# Patient Record
Sex: Male | Born: 2005 | Race: White | Hispanic: No | Marital: Single | Smoking: Never smoker
Health system: Southern US, Community
[De-identification: ages and names within clinical notes are randomized; demographics above are authoritative.]

## PROBLEM LIST (undated history)

## (undated) DIAGNOSIS — F840 Autistic disorder: Secondary | ICD-10-CM

## (undated) DIAGNOSIS — F909 Attention-deficit hyperactivity disorder, unspecified type: Secondary | ICD-10-CM

## (undated) DIAGNOSIS — J45990 Exercise induced bronchospasm: Secondary | ICD-10-CM

## (undated) DIAGNOSIS — D45 Polycythemia vera: Secondary | ICD-10-CM

## (undated) DIAGNOSIS — Z87442 Personal history of urinary calculi: Secondary | ICD-10-CM

## (undated) DIAGNOSIS — F88 Other disorders of psychological development: Secondary | ICD-10-CM

## (undated) HISTORY — PX: NO PAST SURGERIES: SHX2092

---
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
## 2023-01-06 ENCOUNTER — Encounter (HOSPITAL_BASED_OUTPATIENT_CLINIC_OR_DEPARTMENT_OTHER): Payer: Self-pay

## 2023-01-06 ENCOUNTER — Other Ambulatory Visit: Payer: Self-pay

## 2023-01-06 ENCOUNTER — Emergency Department (HOSPITAL_BASED_OUTPATIENT_CLINIC_OR_DEPARTMENT_OTHER)
Admission: EM | Admit: 2023-01-06 | Discharge: 2023-01-06 | Disposition: A | Discharge status: Home / Self Care | Payer: 59 | Attending: Emergency Medicine | Admitting: Emergency Medicine

## 2023-01-06 DIAGNOSIS — R21 Rash and other nonspecific skin eruption: Secondary | ICD-10-CM | POA: Diagnosis present

## 2023-01-06 DIAGNOSIS — L03115 Cellulitis of right lower limb: Secondary | ICD-10-CM | POA: Insufficient documentation

## 2023-01-06 MED ORDER — DOXYCYCLINE HYCLATE 100 MG PO CAPS: 100.0000 mg | ORAL_CAPSULE | Freq: Two times a day (BID) | ORAL | 0 refills | Status: AC

## 2023-01-06 MED ORDER — DOXYCYCLINE HYCLATE 100 MG PO TABS
100.0000 mg | ORAL_TABLET | Freq: Once | ORAL | Status: AC
  Administered 2023-01-06: 100 mg via ORAL
  Filled 2023-01-06: qty 1

## 2023-01-06 NOTE — ED Provider Notes (Signed)
Emergency Department Provider Note   I have reviewed the triage vital signs and the nursing notes.   HISTORY  Chief Complaint Insect Bite   HPI Anthony Pearson is a 17 y.o. male with PMH of ADHD presents emergency department for evaluation of right thigh rash.  He has noticed 2 areas of circular rash with swelling and some tenderness.  He is unsure what insect bit him but suspects a spider bite.  No fevers.  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  No known tick bites.   Past Medical History:  Diagnosis Date   ADHD (attention deficit hyperactivity disorder)    Polycythemia vera ([facility])    Sensory integration disorder of childhood     Review of Systems  Constitutional: No fever/chills Cardiovascular: Denies chest pain. Respiratory: Denies shortness of breath. Gastrointestinal: No abdominal pain.   Skin: Positive right leg rash.  ____________________________________________   PHYSICAL EXAM:  VITAL SIGNS: ED Triage Vitals  Enc Vitals Group     BP 01/06/23 2141 (!) 137/119     Pulse Rate 01/06/23 2141 95     Resp 01/06/23 2141 18     Temp 01/06/23 2141 98.6 F (37 C)     Temp Source 01/06/23 2141 Oral     SpO2 01/06/23 2141 100 %     Weight 01/06/23 2143 240 lb (108.9 kg)     Height 01/06/23 2143 6\' 1"  (1.854 m)   Constitutional: Alert and oriented. Well appearing and in no acute distress. Eyes: Conjunctivae are normal.  Head: Atraumatic. Nose: No congestion/rhinnorhea. Mouth/Throat: Mucous membranes are moist.   Neck: No stridor.   Respiratory: Normal respiratory effort. Gastrointestinal: No distention.  Musculoskeletal: Painful rash to the right thigh.  Neurologic:  Normal speech and language.  Skin:  Skin is warm, dry and intact. Two areas of circular rash to the posterior thigh. No fluctuance or obvious abscess.   ____________________________________________   PROCEDURES  Procedure(s)  performed:   Procedures  None  ____________________________________________   INITIAL IMPRESSION / ASSESSMENT AND PLAN / ED COURSE  Pertinent labs & imaging results that were available during my care of the patient were reviewed by me and considered in my medical decision making (see chart for details).   This patient is Presenting for Evaluation of rash, which does require a range of treatment options, and is a complaint that involves a moderate risk of morbidity and mortality.  The Differential Diagnoses include insect bite, tick bite, tinea, cellulitis, contact dermatitis, etc.  Medical Decision Making: Summary:  Patient presents the emergency department 2 areas of circular rash to the right upper thigh.  I do not appreciate any abscess or severe cellulitis.  No SIRS vitals. Plan to cover with Doxy and PCP follow up. Discussed strict ED return precautions.   Patient's presentation is most consistent with acute, uncomplicated illness.   Disposition: discharge  ____________________________________________  FINAL CLINICAL IMPRESSION(S) / ED DIAGNOSES  Final diagnoses:  Cellulitis of right lower extremity    NEW OUTPATIENT MEDICATIONS STARTED DURING THIS VISIT:  Discharge Medication List as of 01/06/2023 10:04 PM     START taking these medications   Details  doxycycline (VIBRAMYCIN) 100 MG capsule Take 1 capsule (100 mg total) by mouth 2 (two) times daily for 7 days., Starting Wed 01/06/2023, Until Wed 01/13/2023, Normal        Note:  This document was prepared using Dragon voice recognition software and may include unintentional  dictation errors.  Alona Bene, MD, [facility]    Tipton Ballow, Arlyss Repress, MD 01/07/23 [phone]

## 2023-01-06 NOTE — ED Triage Notes (Signed)
Suspected insect bite to posterior right upper leg. Unknown time of encounter. Noticed redness, swelling and tenderness that prompted evaluation.

## 2023-01-06 NOTE — ED Notes (Signed)
Pt verbalized understanding of d/c instructions, meds, and followup care. Denies questions. VSS, no distress noted. Steady gait to exit with all belongings.  ?

## 2023-01-06 NOTE — Discharge Instructions (Signed)
Please take your antibiotics as prescribed. Follow with your PCP in the coming week if symptoms worsen.

## 2023-04-30 ENCOUNTER — Other Ambulatory Visit: Payer: Self-pay | Admitting: Urology

## 2023-05-10 ENCOUNTER — Encounter (HOSPITAL_BASED_OUTPATIENT_CLINIC_OR_DEPARTMENT_OTHER): Payer: Self-pay | Admitting: Urology

## 2023-05-10 NOTE — Progress Notes (Signed)
Spoke w/ via phone for pre-op interview--- pt Lab needs dos----       no  Lab results------ no COVID test -----patient states asymptomatic no test needed Arrive at ------- 1145 on 05-14-2023 NPO after MN NO Solid Food.  Clear liquids from MN until--- 1045 Med rec completed Medications to take morning of surgery ----- none Diabetic medication ----- n/a Patient instructed no nail polish to be worn day of surgery Patient instructed to bring photo id and insurance card day of surgery Patient aware to have Driver (ride ) / caregiver    for 24 hours after surgery - step-father, Anthony Pearson Patient Special Instructions ----- n/a Pre-Op special Instructions -----  able to speak w/ pt with any issue, he is high functioning autism (goes to [employer] first year) Patient verbalized understanding of instructions that were given at this phone interview. Patient denies chest pain, sob, fever, cough at the interview.

## 2023-05-14 ENCOUNTER — Ambulatory Visit (HOSPITAL_BASED_OUTPATIENT_CLINIC_OR_DEPARTMENT_OTHER): Payer: 59 | Admitting: Certified Registered"

## 2023-05-14 ENCOUNTER — Encounter (HOSPITAL_BASED_OUTPATIENT_CLINIC_OR_DEPARTMENT_OTHER): Payer: Self-pay | Admitting: Urology

## 2023-05-14 ENCOUNTER — Encounter (HOSPITAL_BASED_OUTPATIENT_CLINIC_OR_DEPARTMENT_OTHER)
Admission: RE | Disposition: A | Discharge status: Home / Self Care | Payer: Self-pay | Source: Home / Self Care | Attending: Urology

## 2023-05-14 ENCOUNTER — Ambulatory Visit (HOSPITAL_BASED_OUTPATIENT_CLINIC_OR_DEPARTMENT_OTHER)
Admission: RE | Admit: 2023-05-14 | Discharge: 2023-05-14 | Disposition: A | Discharge status: Home / Self Care | Payer: 59 | Attending: Urology | Admitting: Urology

## 2023-05-14 ENCOUNTER — Other Ambulatory Visit: Payer: Self-pay

## 2023-05-14 DIAGNOSIS — F17210 Nicotine dependence, cigarettes, uncomplicated: Secondary | ICD-10-CM | POA: Insufficient documentation

## 2023-05-14 DIAGNOSIS — F901 Attention-deficit hyperactivity disorder, predominantly hyperactive type: Secondary | ICD-10-CM | POA: Insufficient documentation

## 2023-05-14 DIAGNOSIS — N471 Phimosis: Secondary | ICD-10-CM

## 2023-05-14 DIAGNOSIS — J45990 Exercise induced bronchospasm: Secondary | ICD-10-CM | POA: Diagnosis not present

## 2023-05-14 DIAGNOSIS — F840 Autistic disorder: Secondary | ICD-10-CM | POA: Diagnosis not present

## 2023-05-14 DIAGNOSIS — D45 Polycythemia vera: Secondary | ICD-10-CM | POA: Diagnosis not present

## 2023-05-14 DIAGNOSIS — Z01818 Encounter for other preprocedural examination: Secondary | ICD-10-CM

## 2023-05-14 HISTORY — DX: Personal history of urinary calculi: Z87.442

## 2023-05-14 HISTORY — DX: Exercise induced bronchospasm: J45.990

## 2023-05-14 HISTORY — PX: CIRCUMCISION: SHX1350

## 2023-05-14 HISTORY — DX: Autistic disorder: F84.0

## 2023-05-14 SURGERY — CIRCUMCISION, ADULT
Anesthesia: General | Site: Penis

## 2023-05-14 MED ORDER — OXYCODONE HCL 5 MG/5ML PO SOLN: 5.0000 mg | Freq: Once | ORAL | Status: DC | PRN

## 2023-05-14 MED ORDER — PROPOFOL 10 MG/ML IV BOLUS
INTRAVENOUS | Status: AC
  Filled 2023-05-14: qty 20

## 2023-05-14 MED ORDER — BUPIVACAINE HCL 0.5 % IJ SOLN
INTRAMUSCULAR | Status: DC | PRN
  Administered 2023-05-14: 20 mL

## 2023-05-14 MED ORDER — OXYCODONE-ACETAMINOPHEN 5-325 MG PO TABS: 1.0000 | ORAL_TABLET | ORAL | 0 refills | Status: AC | PRN

## 2023-05-14 MED ORDER — LACTATED RINGERS IV SOLN: INTRAVENOUS | Status: DC

## 2023-05-14 MED ORDER — MIDAZOLAM HCL 5 MG/5ML IJ SOLN
INTRAMUSCULAR | Status: DC | PRN
  Administered 2023-05-14: 2 mg via INTRAVENOUS

## 2023-05-14 MED ORDER — CEFAZOLIN SODIUM-DEXTROSE 2-4 GM/100ML-% IV SOLN
2.0000 g | Freq: Once | INTRAVENOUS | Status: AC
  Administered 2023-05-14: 2 g via INTRAVENOUS

## 2023-05-14 MED ORDER — SODIUM CHLORIDE 0.9 % IV SOLN: INTRAVENOUS | Status: DC

## 2023-05-14 MED ORDER — LIDOCAINE HCL (PF) 2 % IJ SOLN
INTRAMUSCULAR | Status: AC
  Filled 2023-05-14: qty 5

## 2023-05-14 MED ORDER — DEXAMETHASONE SODIUM PHOSPHATE 10 MG/ML IJ SOLN
INTRAMUSCULAR | Status: AC
  Filled 2023-05-14: qty 1

## 2023-05-14 MED ORDER — FENTANYL CITRATE (PF) 100 MCG/2ML IJ SOLN
INTRAMUSCULAR | Status: DC | PRN
  Administered 2023-05-14: 100 ug via INTRAVENOUS

## 2023-05-14 MED ORDER — ONDANSETRON HCL 4 MG/2ML IJ SOLN
INTRAMUSCULAR | Status: AC
  Filled 2023-05-14: qty 2

## 2023-05-14 MED ORDER — MEPERIDINE HCL 25 MG/ML IJ SOLN
6.2500 mg | INTRAMUSCULAR | Status: DC | PRN
Start: 2023-05-14 — End: 2023-05-14

## 2023-05-14 MED ORDER — STERILE WATER FOR IRRIGATION IR SOLN
Status: DC | PRN
  Administered 2023-05-14: 1000 mL

## 2023-05-14 MED ORDER — WHITE PETROLATUM EX OINT
TOPICAL_OINTMENT | CUTANEOUS | Status: AC
  Filled 2023-05-14: qty 5

## 2023-05-14 MED ORDER — DEXMEDETOMIDINE HCL IN NACL 80 MCG/20ML IV SOLN
INTRAVENOUS | Status: DC | PRN
  Administered 2023-05-14: 4 ug via INTRAVENOUS
  Administered 2023-05-14 (×2): 8 ug via INTRAVENOUS

## 2023-05-14 MED ORDER — OXYCODONE HCL 5 MG PO TABS: 5.0000 mg | ORAL_TABLET | Freq: Once | ORAL | Status: DC | PRN

## 2023-05-14 MED ORDER — FENTANYL CITRATE (PF) 100 MCG/2ML IJ SOLN: 25.0000 ug | INTRAMUSCULAR | Status: DC | PRN

## 2023-05-14 MED ORDER — DOCUSATE SODIUM 100 MG PO CAPS: 100.0000 mg | ORAL_CAPSULE | Freq: Every day | ORAL | 0 refills | Status: AC | PRN

## 2023-05-14 MED ORDER — MIDAZOLAM HCL 2 MG/2ML IJ SOLN
INTRAMUSCULAR | Status: AC
  Filled 2023-05-14: qty 2

## 2023-05-14 MED ORDER — ONDANSETRON HCL 4 MG/2ML IJ SOLN
INTRAMUSCULAR | Status: DC | PRN
  Administered 2023-05-14: 4 mg via INTRAVENOUS

## 2023-05-14 MED ORDER — ACETAMINOPHEN 325 MG PO TABS: 325.0000 mg | ORAL_TABLET | ORAL | Status: DC | PRN

## 2023-05-14 MED ORDER — LIDOCAINE 2% (20 MG/ML) 5 ML SYRINGE
INTRAMUSCULAR | Status: DC | PRN
  Administered 2023-05-14: 80 mg via INTRAVENOUS

## 2023-05-14 MED ORDER — PROPOFOL 10 MG/ML IV BOLUS
INTRAVENOUS | Status: DC | PRN
  Administered 2023-05-14: 200 mg via INTRAVENOUS

## 2023-05-14 MED ORDER — CEFAZOLIN SODIUM-DEXTROSE 2-4 GM/100ML-% IV SOLN
INTRAVENOUS | Status: AC
  Filled 2023-05-14: qty 100

## 2023-05-14 MED ORDER — BACITRACIN ZINC 500 UNIT/GM EX OINT
TOPICAL_OINTMENT | CUTANEOUS | Status: DC | PRN
  Administered 2023-05-14: 1 via TOPICAL

## 2023-05-14 MED ORDER — DEXMEDETOMIDINE HCL IN NACL 80 MCG/20ML IV SOLN
INTRAVENOUS | Status: AC
  Filled 2023-05-14: qty 20

## 2023-05-14 MED ORDER — FENTANYL CITRATE (PF) 100 MCG/2ML IJ SOLN
INTRAMUSCULAR | Status: AC
  Filled 2023-05-14: qty 2

## 2023-05-14 MED ORDER — DEXAMETHASONE SODIUM PHOSPHATE 10 MG/ML IJ SOLN
INTRAMUSCULAR | Status: DC | PRN
  Administered 2023-05-14: 10 mg via INTRAVENOUS

## 2023-05-14 MED ORDER — KETOROLAC TROMETHAMINE 30 MG/ML IJ SOLN: 30.0000 mg | Freq: Once | INTRAMUSCULAR | Status: DC | PRN

## 2023-05-14 MED ORDER — ONDANSETRON HCL 4 MG/2ML IJ SOLN: 4.0000 mg | Freq: Once | INTRAMUSCULAR | Status: DC | PRN

## 2023-05-14 MED ORDER — ACETAMINOPHEN 160 MG/5ML PO SOLN: 325.0000 mg | ORAL | Status: DC | PRN

## 2023-05-14 SURGICAL SUPPLY — 32 items
BLADE SURG 15 STRL LF DISP TIS (BLADE) ×1 IMPLANT
BLADE SURG 15 STRL SS (BLADE) ×1
BNDG CMPR 75X21 PLY HI ABS (MISCELLANEOUS) ×1
BNDG COHESIVE 1X5 TAN STRL LF (GAUZE/BANDAGES/DRESSINGS) ×1 IMPLANT
CATH ROBINSON RED A/P 14FR (CATHETERS) IMPLANT
COVER BACK TABLE 60X90IN (DRAPES) ×1 IMPLANT
COVER MAYO STAND STRL (DRAPES) ×1 IMPLANT
DRAPE LAPAROTOMY 100X72 PEDS (DRAPES) ×1 IMPLANT
ELECT NDL TIP 2.8 STRL (NEEDLE) ×1 IMPLANT
ELECT NEEDLE TIP 2.8 STRL (NEEDLE) ×1
ELECT REM PT RETURN 9FT ADLT (ELECTROSURGICAL) ×1
ELECTRODE REM PT RTRN 9FT ADLT (ELECTROSURGICAL) ×1 IMPLANT
GAUZE 4X4 16PLY ~~LOC~~+RFID DBL (SPONGE) ×1 IMPLANT
GAUZE STRETCH 2X75IN STRL (MISCELLANEOUS) ×1 IMPLANT
GAUZE XEROFORM 1X8 LF (GAUZE/BANDAGES/DRESSINGS) ×1 IMPLANT
GLOVE BIO SURGEON STRL SZ7 (GLOVE) ×1 IMPLANT
GLOVE BIOGEL PI IND STRL 6.5 (GLOVE) IMPLANT
GLOVE BIOGEL PI IND STRL 7.5 (GLOVE) ×1 IMPLANT
GLOVE SURG SS PI 6.5 STRL IVOR (GLOVE) IMPLANT
GOWN STRL REUS W/ TWL LRG LVL3 (GOWN DISPOSABLE) IMPLANT
GOWN STRL REUS W/TWL LRG LVL3 (GOWN DISPOSABLE) ×2 IMPLANT
KIT TURNOVER CYSTO (KITS) ×1 IMPLANT
PACK BASIN DAY SURGERY FS (CUSTOM PROCEDURE TRAY) ×1 IMPLANT
PENCIL SMOKE EVACUATOR (MISCELLANEOUS) ×1 IMPLANT
SLEEVE SCD COMPRESS KNEE MED (STOCKING) ×1 IMPLANT
SOL PREP POV-IOD 4OZ 10% (MISCELLANEOUS) IMPLANT
SUT VIC AB 4-0 SH 27 (SUTURE) ×2
SUT VIC AB 4-0 SH 27XANBCTRL (SUTURE) ×2 IMPLANT
SYR CONTROL 10ML LL (SYRINGE) IMPLANT
TOWEL OR 17X24 6PK STRL BLUE (TOWEL DISPOSABLE) ×1 IMPLANT
TRAY DSU PREP LF (CUSTOM PROCEDURE TRAY) ×1 IMPLANT
WATER STERILE IRR 1000ML POUR (IV SOLUTION) IMPLANT

## 2023-05-14 NOTE — Discharge Instructions (Addendum)
Activity:  You are encouraged to ambulate frequently (about every hour during waking hours) to help prevent blood clots from forming in your legs or lungs.  However, you should not engage in any heavy lifting (> 10-15 lbs), strenuous activity, or straining.  Diet: You should advance your diet as instructed by your physician.  It will be normal to have some bloating, nausea, and abdominal discomfort intermittently.  Prescriptions:  You will be provided a prescription for pain medication to take as needed.  If your pain is not severe enough to require the prescription pain medication, you may take extra strength Tylenol instead which will have less side effects.  You should also take a prescribed stool softener to avoid straining with bowel movements as the prescription pain medication may constipate you.  Incisions: You may remove your dressing bandages 48 hours after surgery if not removed in the hospital.  You will either have some small staples or special tissue glue at each of the incision sites. Once the bandages are removed (if present), the incisions may stay open to air.  You may start showering (but not soaking or bathing in water) the 2nd day after surgery and the incisions simply need to be patted dry after the shower.  No additional care is needed.  What to call us about: You should call the office [phone]) if you develop fever > 101 or develop persistent vomiting. Activity:  You are encouraged to ambulate frequently (about every hour during waking hours) to help prevent blood clots from forming in your legs or lungs.  However, you should not engage in any heavy lifting (> 10-15 lbs), strenuous activity, or straining.  You may remove dressing on Saturday. No sexual activity for 6 weeks while incision heal.

## 2023-05-14 NOTE — H&P (Signed)
Office Visit Report     04/29/2023   --------------------------------------------------------------------------------   Anthony Pearson  MRN: [id number]  DOB: 11-01-2005, 17 year old Male  SSN:    PRIMARY CARE:  Mady Gemma, [place]  PRIMARY CARE FAX:  [phone]  REFERRING:  Mady Gemma, PA  PROVIDER:  Jettie Pagan, M.D.  LOCATION:  [facility] [phone]     --------------------------------------------------------------------------------   CC/HPI: Anthony Pearson is a 17 year old who is seen in consultation today for phimosis.   1. Phimosis:  Patient states that he has long standing history of phimosis. He is sexually active and states that he has some hyper sensory issues with his penis and notes that during sexual activity, his foreskin will commonly tear. He suffered a pretty significant tear about a month ago and since then has been male to retract his foreskin over the glans of his penis. This causes pain. He states he was prescribed a topical steroid cream however this has not caused any improvement. He has no significant voiding issues.   He has a past medical history of depression. He denies taking anticoagulation.     ALLERGIES: No Allergies    MEDICATIONS: None   Notes: ADHD medication   GU PSH: No GU PSH    NON-GU PSH: No Non-GU PSH    GU PMH: None   NON-GU PMH: Depression    FAMILY HISTORY: None   SOCIAL HISTORY: Marital Status: Single Current Smoking Status: Patient does not smoke anymore. Has not smoked since 04/12/2021. Smoked for 2 years. Smoked 1 pack per day.   Tobacco Use Assessment Completed: Used Tobacco in last 30 days? Has never drank.  Drinks 2 caffeinated drinks per day.    REVIEW OF SYSTEMS:    GU Review Male:   Patient reports penile pain. Patient denies frequent urination, burning/ pain with urination, leakage of urine, trouble starting your stream, have to strain to urinate , hard to postpone urination,  erection problems, stream starts and stops, and get up at night to urinate.  Gastrointestinal (Upper):   Patient denies nausea, vomiting, and indigestion/ heartburn.  Gastrointestinal (Lower):   Patient denies diarrhea and constipation.  Constitutional:   Patient denies fever, night sweats, weight loss, and fatigue.  Skin:   Patient denies skin rash/ lesion and itching.  Eyes:   Patient denies blurred vision and double vision.  Ears/ Nose/ Throat:   Patient denies sore throat and sinus problems.  Hematologic/Lymphatic:   Patient denies swollen glands and easy bruising.  Cardiovascular:   Patient denies leg swelling and chest pains.  Respiratory:   Patient denies cough and shortness of breath.  Endocrine:   Patient denies excessive thirst.  Musculoskeletal:   Patient denies back pain and joint pain.  Neurological:   Patient denies headaches and dizziness.  Psychologic:   Patient reports depression and anxiety.    VITAL SIGNS:      04/29/2023 02:26 PM  Weight 260 lb / 117.93 kg  Height 73 in / 185.42 cm  BP 157/83 mmHg  Pulse 87 /min  Temperature 97.1 F / 36.1 C  BMI 34.3 kg/m   GU PHYSICAL EXAMINATION:    Scrotum: No lesions. No edema. No cysts. No warts.  Epididymides: Right: no spermatocele, no masses, no cysts, no tenderness, no induration, no enlargement. Left: no spermatocele, no masses, no cysts, no tenderness, no induration, no enlargement.  Testes: No tenderness, no swelling, no enlargement left testes. No tenderness, no swelling, no enlargement right testes. Normal  location left testes. Normal location right testes. No mass, no cyst, no varicocele, no hydrocele left testes. No mass, no cyst, no varicocele, no hydrocele right testes.  Urethral Meatus: Normal size. No lesion, no wart, no discharge, no polyp. Normal location.  Penis: Uncircumcised, unable to retract foreskin over the glans given tight phimosis with evidence of healing foreskin tears present.   MULTI-SYSTEM  PHYSICAL EXAMINATION:    Constitutional: Well-nourished. No physical deformities. Normally developed. Good grooming.  Respiratory: No labored breathing, no use of accessory muscles.   Cardiovascular: Normal temperature, normal extremity pulses, no swelling, no varicosities.  Gastrointestinal: No mass, no tenderness, no rigidity, non obese abdomen.     Complexity of Data:  Source Of History:  Patient, Medical Record Summary  Records Review:   Previous Doctor Records, Previous Patient Records  Urine Test Review:   Urinalysis   PROCEDURES:          Visit Complexity - G2211    ASSESSMENT:      ICD-10 Details  1 GU:   Phimosis - N47.1    PLAN:           Document Letter(s):  Created for Patient: Clinical Summary         Notes:   1. Phimosis:  -We discussed options and he would like to proceed with circumcision. Discussed risk benefits including risk of infection, bleeding, recurrence of scar tissue. He understands these risks and wants to proceed. Surgery letter submitted.    Urology Preoperative H&P   Chief Complaint: phimosis  History of Present Illness: Anthony Pearson is a 17 y.o. male with phimosis here for circumcision.    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 His smoking use included cigarettes. He quit smokeless tobacco use about 3 years ago.  His smokeless tobacco use included snuff. He reports that he does  not currently use drugs. He reports that he does not drink alcohol.  ROS: A complete review of systems was performed.  All systems are negative except for pertinent findings as noted.  Physical Exam:  Vital signs in last 24 hours: Temp:  [97.9 F (36.6 C)] 97.9 F (36.6 C) (11/01 1209) Pulse Rate:  [84] 84 (11/01 1209) Resp:  [18] 18 (11/01 1209) BP: (126)/(78) 126/78 (11/01 1209) SpO2:  [96 %] 96 % (11/01 1209) Weight:  [121.7 kg] 121.7 kg (11/01 1209) Constitutional:  Alert and oriented, No acute distress Cardiovascular: Regular rate and rhythm Respiratory: Normal respiratory effort, Lungs clear bilaterally GI: Abdomen is soft, nontender, nondistended, no abdominal masses GU: No CVA tenderness Lymphatic: No lymphadenopathy Neurologic: Grossly intact, no focal deficits Psychiatric: Normal mood and affect  Laboratory Data:  No results for input(s): "WBC", "HGB", "HCT", "PLT" in the last 72 hours.  No results for input(s): "NA", "K", "CL", "GLUCOSE", "BUN", "CALCIUM", "CREATININE" in the last 72  hours.  Invalid input(s): "CO3"   No results found for this or any previous visit (from the past 24 hour(s)). No results found for this or any previous visit (from the past 240 hour(s)).  Renal Function: No results for input(s): "CREATININE" in the last 168 hours. CrCl cannot be calculated (Patient's most recent lab result is older than the maximum 21 days allowed.).  Radiologic Imaging: No results found.  I independently reviewed the above imaging studies.  Assessment and Plan Anthony Pearson is a 17 y.o. male with phimosis here for circumcision.     Anthony R. Kymberli Wiegand MD 05/14/2023, 12:34 PM  [facility] Pager: [phone]): [phone]

## 2023-05-14 NOTE — Op Note (Signed)
Operative Note  Preoperative diagnosis:  1.  Phimosis  Postoperative diagnosis: 1.  Phimosis  Procedure(s): 1.  Circumcision 2. Dorsal penile nerve block  Surgeon: Jettie Pagan, MD  Assistants:  None  Anesthesia:  General  Complications:  None  EBL:  <50ml  Specimens: 1. Foreskin for discard  Drains/Catheters: 1.  none  Intraoperative findings:   Phimosis Uncomplicated circumcision  Indication:  Anthony Pearson is a 17 y.o. male who presented with phimosis and after being counseled with options he elected to have circumcision.   Description of procedure: The patient was brought back to the operating room, placed on table in a supine position.  He had bilateral sequential compression devices placed and received a dose of Ancef 2 gr preoperatively for antibiotic prophylaxis.  He was placed under general LM anesthesia and then standard prep was performed.  He was prepped and draped in standard sterile fashion.  Surgical time-out was performed identifying the correct patient, procedure, and all were in agreement.   A dorsal penile nerve block was given with .25% Marcaine plain, a total of 20mL used. I was initially unable to retract his foreskin and had to first perform a dorsal slit. Then, the prepuce was retracted and a subcoronal incision made at 1 cm from sulcus. Hemostasis was obtained with bipolar. The frenulum was reapproximated with a running and interrupted 4-0 vicryl. Hemostats were used to provide traction on the redundant prepuce and a dorsal slit was made with mets the incise the appropriate length of skin for removal. An anchoring interrupted 4-0 vicryl was placed reapproximate the skin, leaving the tail long to provide traction. Scissors were used to make a ventral slit and another vicryl was placed at this location. An incision was made on the skin between these two vicryls. The tissue was then removed with cautery to control any blood vessels at the dartos fascia.  Procedure was repeated on the other side, incising the skin and removing with cautery. Hemostasis was intact. Interrupted 4-0 vicryls were placed in interrupted fashion. Additional vicryls were placed at the frenulum for hemostasis. Satisfied with the cosmesis, we finalized the case with the dressing; Xerform, covered with Bioclusive film.  Plan:  The patient was awoken from general anesthesia, transferred to the PACU in stable condition.  He will be recovered in the PACU and then discharged.  Matt R. Mannat Benedetti MD [facility]  Pager: [phone]

## 2023-05-14 NOTE — Anesthesia Preprocedure Evaluation (Signed)
Anesthesia Evaluation  Patient identified by MRN, date of birth, ID band Patient awake    Reviewed: Allergy & Precautions, H&P , NPO status , Patient's Chart, lab work & pertinent test results  Airway Mallampati: II       Dental  (+) Poor Dentition   Pulmonary former smoker   Pulmonary exam normal        Cardiovascular negative cardio ROS Normal cardiovascular exam     Neuro/Psych negative neurological ROS     GI/Hepatic negative GI ROS, Neg liver ROS,,,  Endo/Other  negative endocrine ROS    Renal/GU negative Renal ROS  negative genitourinary   Musculoskeletal negative musculoskeletal ROS (+)    Abdominal  (+) + obese  Peds negative pediatric ROS (+)  Hematology negative hematology ROS (+)   Anesthesia Other Findings   Reproductive/Obstetrics                             Anesthesia Physical Anesthesia Plan  ASA: 2  Anesthesia Plan: General   Post-op Pain Management:    Induction: Intravenous  PONV Risk Score and Plan: 4 or greater and Ondansetron, Dexamethasone and Midazolam  Airway Management Planned: LMA  Additional Equipment: None  Intra-op Plan:   Post-operative Plan: Extubation in OR  Informed Consent: I have reviewed the patients History and Physical, chart, labs and discussed the procedure including the risks, benefits and alternatives for the proposed anesthesia with the patient or authorized representative who has indicated his/her understanding and acceptance.     Dental advisory given  Plan Discussed with: CRNA  Anesthesia Plan Comments:        Anesthesia Quick Evaluation

## 2023-05-14 NOTE — Transfer of Care (Signed)
Immediate Anesthesia Transfer of Care Note  Patient: Anthony Pearson  Procedure(s) Performed: Procedure(s) (LRB): CIRCUMCISION ADULT (N/A)  Patient Location: PACU  Anesthesia Type: General  Level of Consciousness: awake, oriented, sedated and patient cooperative  Airway & Oxygen Therapy: Patient Spontanous Breathing and Patient connected to face mask oxygen  Post-op Assessment: Report given to PACU RN and Post -op Vital signs reviewed and stable  Post vital signs: Reviewed and stable  Complications: No apparent anesthesia complications Last Vitals:  Vitals Value Taken Time  BP 95/45 05/14/23 1445  Temp    Pulse 72 05/14/23 1446  Resp 15 05/14/23 1446  SpO2 98 % 05/14/23 1446  Vitals shown include unfiled device data.  Last Pain:  Vitals:   05/14/23 1209  TempSrc: Oral  PainSc: 0-No pain      Patients Stated Pain Goal: 6 (05/14/23 1209)  Complications: No notable events documented.

## 2023-05-18 ENCOUNTER — Encounter (HOSPITAL_BASED_OUTPATIENT_CLINIC_OR_DEPARTMENT_OTHER): Payer: Self-pay | Admitting: Urology

## 2023-06-01 NOTE — Anesthesia Postprocedure Evaluation (Signed)
Anesthesia Post Note  Patient: Anthony Pearson  Procedure(s) Performed: CIRCUMCISION ADULT (Penis)     Patient location during evaluation: PACU Anesthesia Type: General Level of consciousness: awake Pain management: pain level controlled Vital Signs Assessment: post-procedure vital signs reviewed and stable Respiratory status: spontaneous breathing Cardiovascular status: stable Postop Assessment: no apparent nausea or vomiting Anesthetic complications: no  No notable events documented.  Last Vitals:  Vitals:   05/14/23 1515 05/14/23 1537  BP: 123/67 118/67  Pulse: 88   Resp:  18  Temp: (!) 36.3 C (!) 36.3 C  SpO2:      Last Pain:  Vitals:   05/14/23 1537  TempSrc:   PainSc: 0-No pain                 Caren Macadam

## 2024-07-10 ENCOUNTER — Emergency Department (HOSPITAL_COMMUNITY): Payer: Self-pay

## 2024-07-10 ENCOUNTER — Emergency Department (HOSPITAL_COMMUNITY): Admission: EM | Admit: 2024-07-10 | Discharge: 2024-07-11 | Payer: Self-pay

## 2024-07-10 ENCOUNTER — Other Ambulatory Visit: Payer: Self-pay

## 2024-07-10 DIAGNOSIS — R2241 Localized swelling, mass and lump, right lower limb: Secondary | ICD-10-CM | POA: Insufficient documentation

## 2024-07-10 DIAGNOSIS — Z5321 Procedure and treatment not carried out due to patient leaving prior to being seen by health care provider: Secondary | ICD-10-CM | POA: Insufficient documentation

## 2024-07-10 DIAGNOSIS — M25561 Pain in right knee: Secondary | ICD-10-CM | POA: Insufficient documentation

## 2024-07-10 NOTE — ED Triage Notes (Signed)
 Pt states that he has had pain, swelling and redness to right knee x 1 day. Denies specific injury. Pt states that Urgent Care suggested he come here for possible septic joint.

## 2024-07-11 NOTE — ED Notes (Signed)
Patient left due to wait time
